# Patient Record
Sex: Male | Born: 1945 | Race: White | Hispanic: No | Marital: Married | Smoking: Former smoker
Health system: Southern US, Community
[De-identification: ages and names within clinical notes are randomized; demographics above are authoritative.]

## PROBLEM LIST (undated history)

## (undated) DIAGNOSIS — K2270 Barrett's esophagus without dysplasia: Secondary | ICD-10-CM

## (undated) DIAGNOSIS — M1990 Unspecified osteoarthritis, unspecified site: Secondary | ICD-10-CM

## (undated) DIAGNOSIS — K219 Gastro-esophageal reflux disease without esophagitis: Secondary | ICD-10-CM

## (undated) HISTORY — PX: BACK SURGERY: SHX140

## (undated) HISTORY — PX: COLON SURGERY: SHX602

## (undated) HISTORY — PX: CHOLECYSTECTOMY: SHX55

---

## 2006-01-25 ENCOUNTER — Ambulatory Visit: Payer: Self-pay | Admitting: Nurse Practitioner

## 2006-02-03 ENCOUNTER — Ambulatory Visit: Payer: Self-pay | Admitting: Unknown Physician Specialty

## 2007-02-01 ENCOUNTER — Ambulatory Visit: Payer: Self-pay | Admitting: Gastroenterology

## 2007-03-13 ENCOUNTER — Ambulatory Visit: Payer: Self-pay | Admitting: Internal Medicine

## 2007-05-08 ENCOUNTER — Ambulatory Visit: Payer: Self-pay | Admitting: Internal Medicine

## 2007-11-01 ENCOUNTER — Emergency Department: Payer: Self-pay | Admitting: Emergency Medicine

## 2007-11-01 ENCOUNTER — Other Ambulatory Visit: Payer: Self-pay

## 2007-11-01 ENCOUNTER — Ambulatory Visit: Payer: Self-pay | Admitting: Emergency Medicine

## 2007-11-02 ENCOUNTER — Ambulatory Visit: Payer: Self-pay

## 2007-11-05 ENCOUNTER — Ambulatory Visit: Payer: Self-pay | Admitting: Cardiology

## 2007-11-28 ENCOUNTER — Ambulatory Visit: Payer: Self-pay | Admitting: Unknown Physician Specialty

## 2008-11-06 ENCOUNTER — Ambulatory Visit: Payer: Self-pay | Admitting: Internal Medicine

## 2008-11-14 ENCOUNTER — Ambulatory Visit: Payer: Self-pay | Admitting: Internal Medicine

## 2008-11-27 ENCOUNTER — Ambulatory Visit: Payer: Self-pay | Admitting: Surgery

## 2008-12-02 ENCOUNTER — Inpatient Hospital Stay: Payer: Self-pay | Admitting: Surgery

## 2009-07-05 ENCOUNTER — Emergency Department: Payer: Self-pay | Admitting: Emergency Medicine

## 2009-07-20 ENCOUNTER — Ambulatory Visit: Payer: Self-pay | Admitting: Gastroenterology

## 2010-04-29 ENCOUNTER — Ambulatory Visit: Payer: Self-pay | Admitting: Internal Medicine

## 2010-05-04 ENCOUNTER — Ambulatory Visit: Payer: Self-pay | Admitting: Internal Medicine

## 2010-11-16 IMAGING — US ABDOMEN ULTRASOUND
1 series · 16 of 25 positions shown · non-contrast
Comparison: none

REASON FOR EXAM: abn liver function tests hematuria
COMMENTS:

[Series 1: abdomen ultrasound · 16 of 59 slices shown]
[im 1/59]
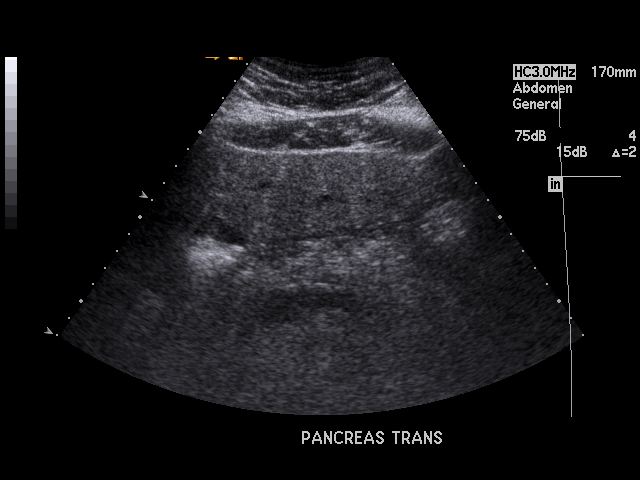
[im 5/59]
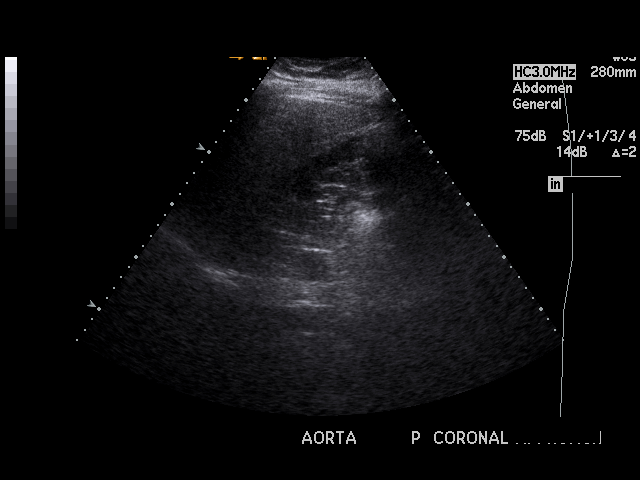
[im 8/59]
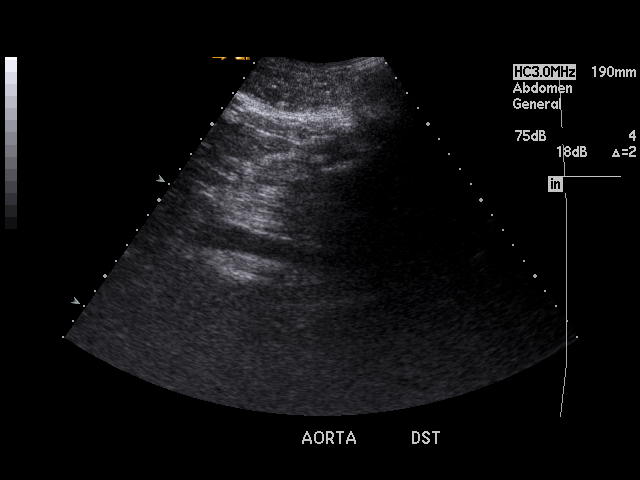
[im 13/59]
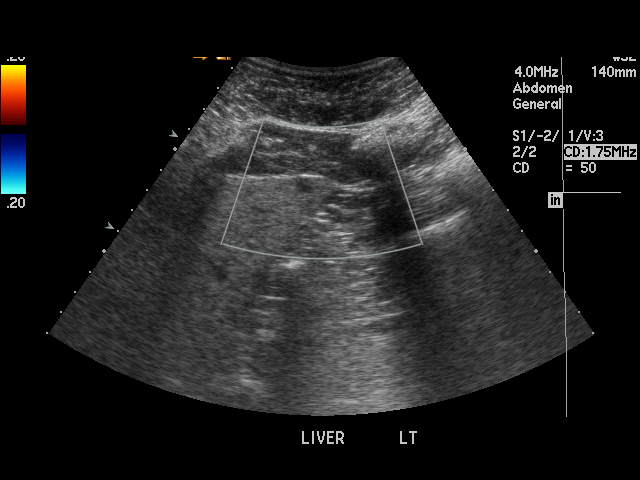
[im 17/59]
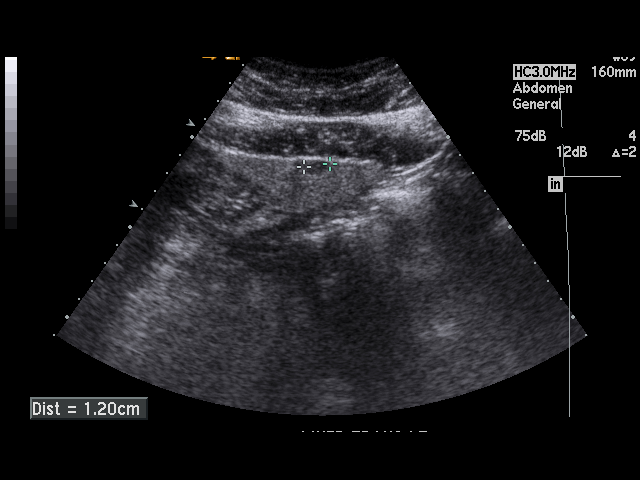
[im 20/59]
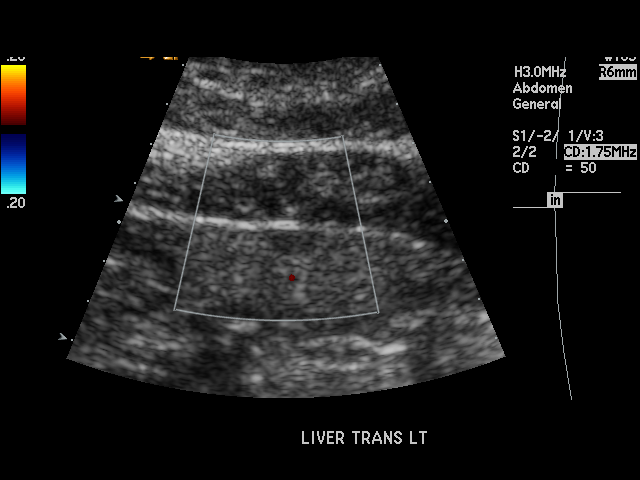
[im 25/59]
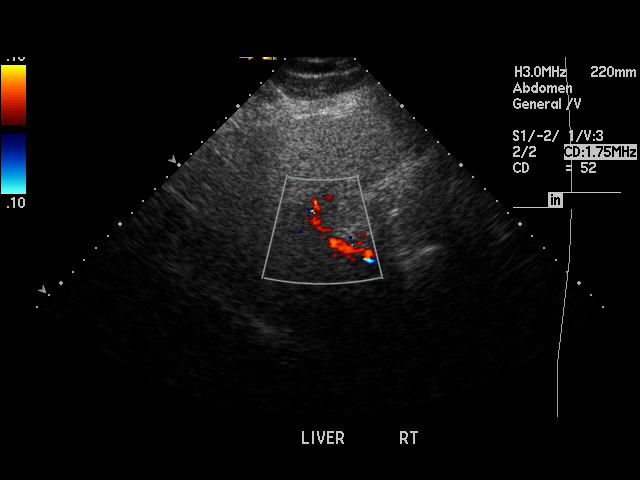
[im 27/59]
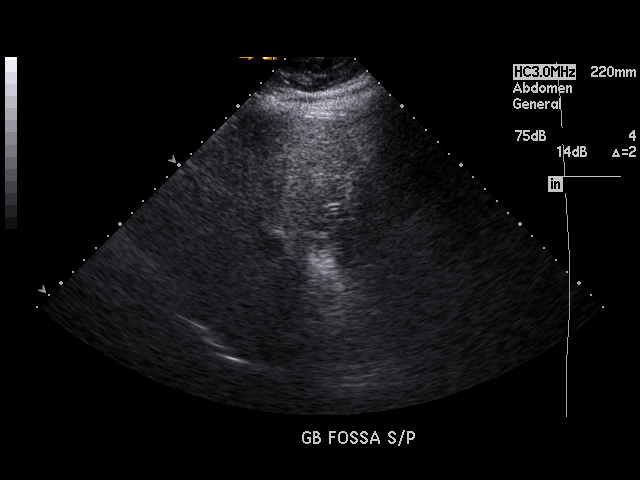
[im 32/59]
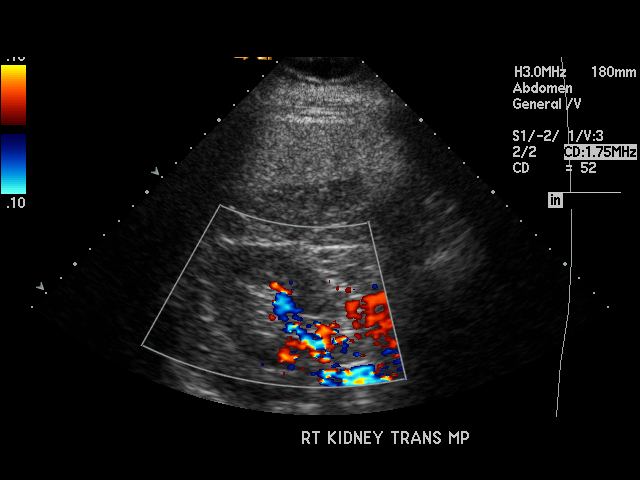
[im 34/59]
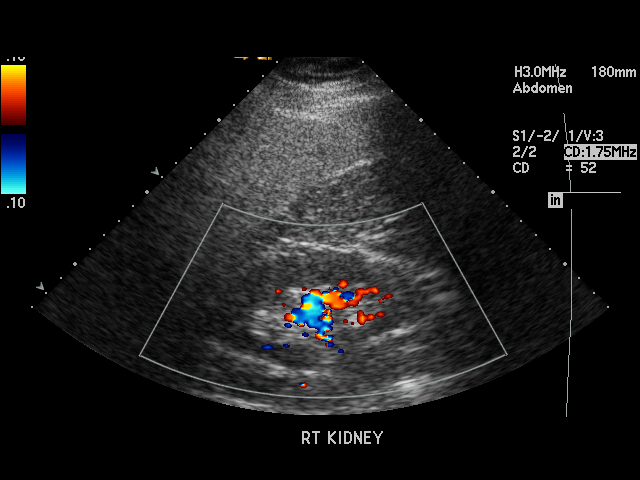
[im 39/59]
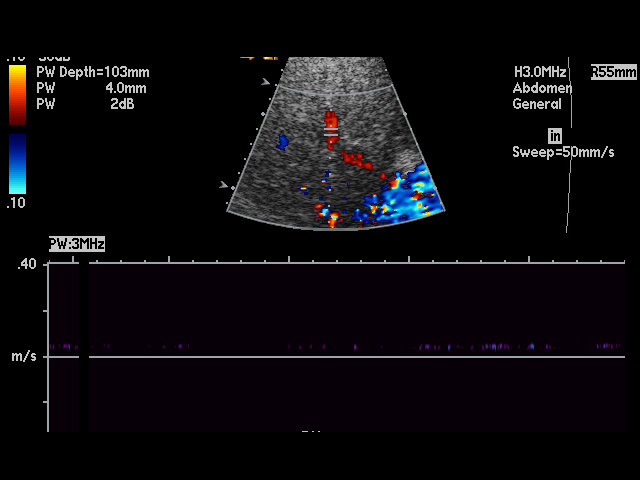
[im 42/59]
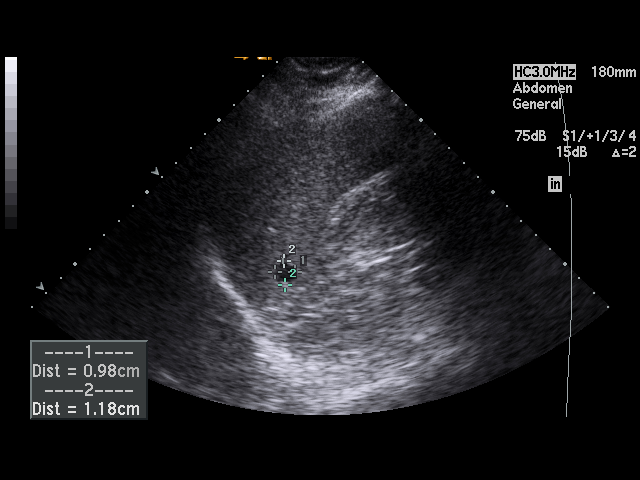
[im 46/59]
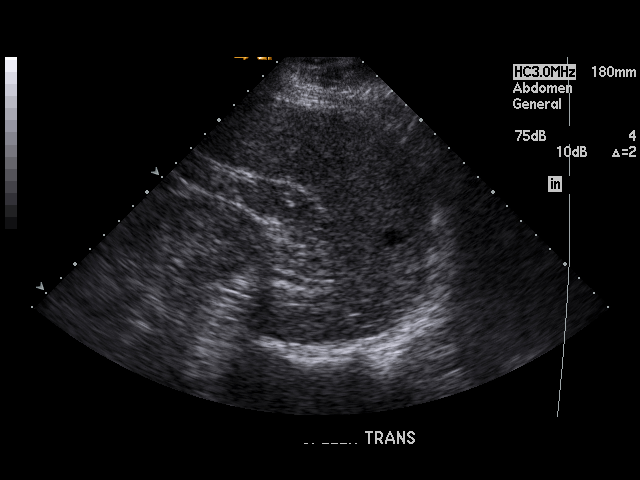
[im 51/59]
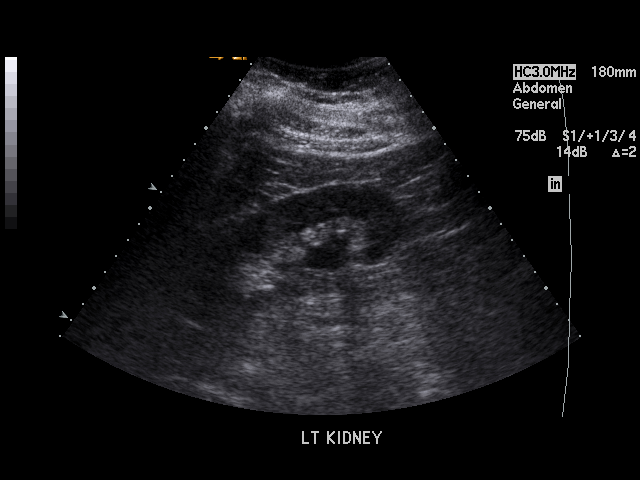
[im 54/59]
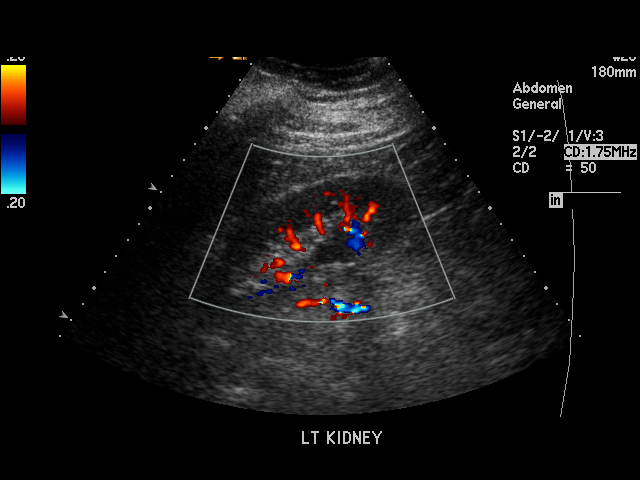
[im 59/59]
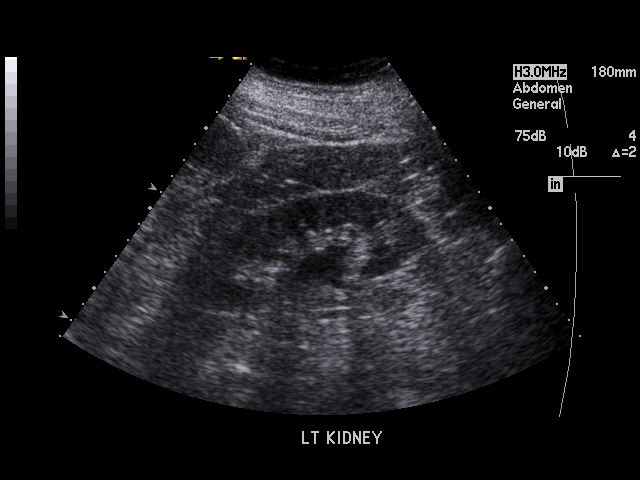

[16 of 25 positions shown; findings below may reference images not displayed]

PROCEDURE:     US  - US ABDOMEN GENERAL SURVEY  - April 29, 2010 [DATE]

RESULT:     The liver is hyperechogenic, consistent with fatty infiltration.
In the left lobe there is an oval shaped 1.2 cm sharply marginated
hypoechoic area along the anterior margin of the left lobe medially. There
are internal echoes present. No definite posterior enhancement is seen. The
finding may represent a cyst that is too small to characterize
sonographically but a small solid lesion cannot be excluded. Additional
evaluation by repeat liver CT is suggested or alternatively follow-up
examination to document stability. This finding is not seen on review of the
prior ultrasound July 20, 2009. The possible hemangioma noted at
prior CT is not identified sonographically on this exam.

The pancreas is not visualized adequately for evaluation. A portion of the
pancreatic body is visualized and shows no significant abnormalities but the
pancreas otherwise is obscured by bowel gas. The abdominal aorta and
inferior vena cava show no acute changes. The spleen is mildly enlarged. The
spleen measures 14.13 cm at maximum AP diameter. There is a 1.13 cm splenic
cyst. The gallbladder is not visualized compatible with a history of prior
cholecystectomy. The common bile duct measures 6.9 mm. The kidneys show no
hydronephrosis.
IMPRESSION: 1. Probable fatty infiltration of the liver.
2. There is a small hypoechoic mass anteriorly in the left lobe of the
liver. Although likely a small hepatic cyst, the sonographic characteristics
are not sufficient to be certain. This could be further evaluated by hepatic
CT.
3. The patient is status post cholecystectomy.
4. The pancreas is in great part obscured by bowel gas.
5. There is mild splenic enlargement.
6. No ascites is seen.

## 2011-03-08 ENCOUNTER — Ambulatory Visit: Payer: Self-pay | Admitting: Gastroenterology

## 2011-03-15 LAB — PATHOLOGY REPORT

## 2011-09-21 ENCOUNTER — Ambulatory Visit: Payer: Self-pay | Admitting: Gastroenterology

## 2011-09-23 LAB — PATHOLOGY REPORT

## 2012-01-30 ENCOUNTER — Ambulatory Visit: Payer: Self-pay | Admitting: Internal Medicine

## 2012-02-09 ENCOUNTER — Ambulatory Visit: Payer: Self-pay | Admitting: Oncology

## 2012-02-10 ENCOUNTER — Ambulatory Visit: Payer: Self-pay | Admitting: Oncology

## 2012-02-10 ENCOUNTER — Ambulatory Visit: Payer: Self-pay | Admitting: Gastroenterology

## 2012-02-13 ENCOUNTER — Ambulatory Visit: Payer: Self-pay | Admitting: Oncology

## 2012-02-17 LAB — CANCER ANTIGEN 19-9: CA 19-9: 12 U/mL (ref 0–35)

## 2012-02-20 ENCOUNTER — Ambulatory Visit: Payer: Self-pay | Admitting: Oncology

## 2012-02-22 ENCOUNTER — Other Ambulatory Visit: Payer: Self-pay

## 2012-02-22 ENCOUNTER — Telehealth: Payer: Self-pay

## 2012-02-22 DIAGNOSIS — R160 Hepatomegaly, not elsewhere classified: Secondary | ICD-10-CM

## 2012-02-22 NOTE — Telephone Encounter (Signed)
Pt has been notified meds reviewed and instructed pt instructions also mailed to his home

## 2012-02-22 NOTE — Telephone Encounter (Signed)
Pt needs EUS with propofol if possible on 02/28/12 noon block the 130 and 145 cases in the office.  Case put in EPIC waiting on Noreene Larsson to confirm

## 2012-02-22 NOTE — Telephone Encounter (Signed)
I spoke with Noreene Larsson and she confirmed the case for 02/28/12 noon with [facility]

## 2012-02-28 ENCOUNTER — Encounter (HOSPITAL_COMMUNITY): Payer: Self-pay

## 2012-02-28 ENCOUNTER — Ambulatory Visit (HOSPITAL_COMMUNITY): Payer: Medicare Other | Admitting: Anesthesiology

## 2012-02-28 ENCOUNTER — Ambulatory Visit (HOSPITAL_COMMUNITY)
Admission: RE | Admit: 2012-02-28 | Discharge: 2012-02-28 | Disposition: A | Discharge status: Home / Self Care | Payer: Medicare Other | Source: Ambulatory Visit | Attending: Gastroenterology | Admitting: Gastroenterology

## 2012-02-28 ENCOUNTER — Encounter (HOSPITAL_COMMUNITY)
Admission: RE | Disposition: A | Discharge status: Home / Self Care | Payer: Self-pay | Source: Ambulatory Visit | Attending: Gastroenterology

## 2012-02-28 ENCOUNTER — Encounter (HOSPITAL_COMMUNITY): Payer: Self-pay | Admitting: Anesthesiology

## 2012-02-28 DIAGNOSIS — K219 Gastro-esophageal reflux disease without esophagitis: Secondary | ICD-10-CM | POA: Insufficient documentation

## 2012-02-28 DIAGNOSIS — C801 Malignant (primary) neoplasm, unspecified: Secondary | ICD-10-CM | POA: Insufficient documentation

## 2012-02-28 DIAGNOSIS — K769 Liver disease, unspecified: Secondary | ICD-10-CM | POA: Insufficient documentation

## 2012-02-28 DIAGNOSIS — C772 Secondary and unspecified malignant neoplasm of intra-abdominal lymph nodes: Secondary | ICD-10-CM | POA: Insufficient documentation

## 2012-02-28 DIAGNOSIS — R933 Abnormal findings on diagnostic imaging of other parts of digestive tract: Secondary | ICD-10-CM

## 2012-02-28 HISTORY — DX: Barrett's esophagus without dysplasia: K22.70

## 2012-02-28 HISTORY — DX: Gastro-esophageal reflux disease without esophagitis: K21.9

## 2012-02-28 HISTORY — DX: Unspecified osteoarthritis, unspecified site: M19.90

## 2012-02-28 HISTORY — PX: EUS: SHX5427

## 2012-02-28 SURGERY — UPPER ENDOSCOPIC ULTRASOUND (EUS) LINEAR
Anesthesia: Monitor Anesthesia Care

## 2012-02-28 MED ORDER — LIDOCAINE HCL 1 % IJ SOLN
INTRAMUSCULAR | Status: DC | PRN
  Administered 2012-02-28: 50 mg via INTRADERMAL

## 2012-02-28 MED ORDER — FENTANYL CITRATE 0.05 MG/ML IJ SOLN
INTRAMUSCULAR | Status: DC | PRN
  Administered 2012-02-28: 100 ug via INTRAVENOUS

## 2012-02-28 MED ORDER — LACTATED RINGERS IV SOLN
INTRAVENOUS | Status: AC | PRN
  Administered 2012-02-28: 12:00:00 via INTRAVENOUS

## 2012-02-28 MED ORDER — PROPOFOL 10 MG/ML IV EMUL
INTRAVENOUS | Status: DC | PRN
  Administered 2012-02-28: 120 ug/kg/min via INTRAVENOUS

## 2012-02-28 MED ORDER — MIDAZOLAM HCL 5 MG/5ML IJ SOLN
INTRAMUSCULAR | Status: DC | PRN
  Administered 2012-02-28: 2 mg via INTRAVENOUS

## 2012-02-28 MED ORDER — LACTATED RINGERS IV SOLN
INTRAVENOUS | Status: DC
  Administered 2012-02-28: 1000 mL via INTRAVENOUS

## 2012-02-28 NOTE — Anesthesia Preprocedure Evaluation (Addendum)
Anesthesia Evaluation  Patient identified by MRN, date of birth, ID band Patient awake    Reviewed: Allergy & Precautions, H&P , NPO status , Patient's Chart, lab work & pertinent test results  Airway Mallampati: III TM Distance: >3 FB Neck ROM: full    Dental  (+) Caps and Dental Advisory Given,    Pulmonary neg pulmonary ROS,  breath sounds clear to auscultation  Pulmonary exam normal       Cardiovascular Exercise Tolerance: Good negative cardio ROS  Rhythm:regular Rate:Normal     Neuro/Psych negative neurological ROS  negative psych ROS   GI/Hepatic negative GI ROS, Neg liver ROS, GERD-  Medicated and Controlled,  Endo/Other  negative endocrine ROS  Renal/GU negative Renal ROS  negative genitourinary   Musculoskeletal   Abdominal   Peds  Hematology negative hematology ROS (+)   Anesthesia Other Findings   Reproductive/Obstetrics negative OB ROS                          Anesthesia Physical Anesthesia Plan  ASA: I  Anesthesia Plan: MAC   Post-op Pain Management:    Induction:   Airway Management Planned:   Additional Equipment:   Intra-op Plan:   Post-operative Plan:   Informed Consent: I have reviewed the patients History and Physical, chart, labs and discussed the procedure including the risks, benefits and alternatives for the proposed anesthesia with the patient or authorized representative who has indicated his/her understanding and acceptance.   Dental Advisory Given  Plan Discussed with: CRNA and Surgeon  Anesthesia Plan Comments:         Anesthesia Quick Evaluation

## 2012-02-28 NOTE — Op Note (Signed)
[facility] [address] [place], [place]  [address]  ENDOSCOPIC ULTRASOUND PROCEDURE REPORT  PATIENT:  Jon Roberts, Jon Roberts  MR#:  [id number] BIRTHDATE:  1946/04/28  GENDER:  male ENDOSCOPIST:  Rachael Fee, MD PROCEDURE DATE:  02/28/2012 REFERRING:   Christian Mate, MD at [facility] PROCEDURE:  Upper EUS w/FNA ASA CLASS:  Class II INDICATIONS:  large mass in liver, periportal adenopathy (all PET avid).  AFP > 2,000 MEDICATIONS:   MAC sedation, administered by CRNA  DESCRIPTION OF PROCEDURE:   After the risks benefits and alternatives of the procedure were  explained, informed consent was obtained. The patient was then placed in the left, lateral, decubitus postion and IV sedation was administered. Throughout the procedure, the patient's blood pressure, pulse and oxygen saturations were monitored continuously.  Under direct visualization, the Pentax Radial EUS [id number] and Pentax Linear [id number] endoscope was introduced through the mouth and advanced to the second portion of the duodenum.  Water was used as necessary to provide an acoustic interface.  Upon completion of the imaging, water was removed and the patient was sent to the recovery room in satisfactory condition. <<PROCEDUREIMAGES>>  Endoscopic findings: 1. Normal esophagus, stomach and duodenum  EUS findings: 1. Poorly visualized, irregularly bordered large mass in right lobe of liver. 2. Large, somewhat necrotic appearing, periportal mass that is likely a lymphnode vs tumor deposit.  This as 4.7cm maximally and was sampled with two passes of a 22 gauge BS EUS FNA needle using color doppler. 3. Otherwise limited views of liver, speen, pancreas were all normal  Impression: Mass in liver, periportal adenopathy (vs tumor deposit). A large periportal lymphnode was sampled with FNA, preliminary cytology review is positive for malignancy.  Special staining to check for [facility] will be  performed.  ______________________________ Rachael Fee, MD  n. eSIGNED:   Rachael Fee at 02/28/2012 12:54 PM  Tsosie Billing, [id number]

## 2012-02-28 NOTE — Transfer of Care (Signed)
Immediate Anesthesia Transfer of Care Note  Patient: Jon Roberts  Procedure(s) Performed: Procedure(s) (LRB): UPPER ENDOSCOPIC ULTRASOUND (EUS) LINEAR (N/A)  Patient Location: PACU  Anesthesia Type: MAC  Level of Consciousness: oriented, sedated and patient cooperative  Airway & Oxygen Therapy: Patient Spontanous Breathing and Patient connected to nasal cannula oxygen  Post-op Assessment: Report given to PACU RN, Post -op Vital signs reviewed and stable and Patient moving all extremities  Post vital signs: Reviewed and stable  Complications: No apparent anesthesia complications

## 2012-02-28 NOTE — H&P (Signed)
  HPI: This is a 66 yo man found to have liver mass, periportal lymphnodes.  AFP very elevated    Past Medical History  Diagnosis Date  . GERD (gastroesophageal reflux disease)   . Arthritis   . Barrett's esophagus     Past Surgical History  Procedure Date  . Colon surgery     polyp  . Back surgery     x2  . Cholecystectomy     Current Facility-Administered Medications  Medication Dose Route Frequency Provider Last Rate Last Dose  . lactated ringers infusion   Intravenous Continuous Rachael Fee, MD 20 mL/hr at 02/28/12 1132 1,000 mL at 02/28/12 1132    Allergies as of 02/22/2012  . (Not on File)    History reviewed. No pertinent family history.  History   Social History  . Marital Status: Married    Spouse Name: N/A    Number of Children: N/A  . Years of Education: N/A   Occupational History  . Not on file.   Social History Main Topics  . Smoking status: Former Smoker    Quit date: 02/28/2012  . Smokeless tobacco: Not on file  . Alcohol Use: Yes  . Drug Use: No  . Sexually Active:    Other Topics Concern  . Not on file   Social History Narrative  . No narrative on file      Physical Exam: BP 140/85  Temp 97.8 F (36.6 C) (Oral)  Resp 13  Ht 5\' 9"  (1.753 m)  Wt 205 lb (92.987 kg)  BMI 30.27 kg/m2  SpO2 96% Constitutional: generally well-appearing Psychiatric: alert and oriented x3 Abdomen: soft, nontender, nondistended, no obvious ascites, no peritoneal signs, normal bowel sounds     Assessment and plan: 66 y.o. male with likely HCC, here for EUS, FNA

## 2012-02-29 ENCOUNTER — Encounter (HOSPITAL_COMMUNITY): Payer: Self-pay | Admitting: Gastroenterology

## 2012-03-22 ENCOUNTER — Ambulatory Visit: Payer: Self-pay | Admitting: Oncology

## 2012-04-09 LAB — COMPREHENSIVE METABOLIC PANEL
Albumin: 2.9 g/dL — ABNORMAL LOW (ref 3.4–5.0)
Anion Gap: 10 (ref 7–16)
BUN: 10 mg/dL (ref 7–18)
Bilirubin,Total: 1.3 mg/dL — ABNORMAL HIGH (ref 0.2–1.0)
Chloride: 104 mmol/L (ref 98–107)
Creatinine: 1.05 mg/dL (ref 0.60–1.30)
EGFR (African American): >60
EGFR (Non-African Amer.): >60
Glucose: 109 mg/dL — ABNORMAL HIGH (ref 65–99)
SGOT(AST): 67 U/L — ABNORMAL HIGH (ref 15–37)
Total Protein: 8.3 g/dL — ABNORMAL HIGH (ref 6.4–8.2)

## 2012-04-09 LAB — CBC CANCER CENTER
Eosinophil %: 2 %
HCT: 45.9 % (ref 40.0–52.0)
Lymphocyte #: 1.2 x10 3/mm (ref 1.0–3.6)
MCH: 26.6 pg (ref 26.0–34.0)
MCV: 84 fL (ref 80–100)
Monocyte #: 0.3 x10 3/mm (ref 0.2–1.0)
Neutrophil #: 3.2 x10 3/mm (ref 1.4–6.5)
Platelet: 141 x10 3/mm — ABNORMAL LOW (ref 150–440)
RDW: 16.4 % — ABNORMAL HIGH (ref 11.5–14.5)

## 2012-04-10 LAB — AFP TUMOR MARKER: AFP-Tumor Marker: 1173 ng/mL — ABNORMAL HIGH (ref 0.0–8.3)

## 2012-04-22 ENCOUNTER — Ambulatory Visit: Payer: Self-pay | Admitting: Oncology

## 2012-05-03 ENCOUNTER — Inpatient Hospital Stay: Payer: Self-pay

## 2012-05-03 LAB — COMPREHENSIVE METABOLIC PANEL
Albumin: 2.7 g/dL — ABNORMAL LOW (ref 3.4–5.0)
Alkaline Phosphatase: 348 U/L — ABNORMAL HIGH (ref 50–136)
Calcium, Total: 8.9 mg/dL (ref 8.5–10.1)
Chloride: 106 mmol/L (ref 98–107)
Co2: 23 mmol/L (ref 21–32)
EGFR (African American): >60
EGFR (Non-African Amer.): >60
Glucose: 130 mg/dL — ABNORMAL HIGH (ref 65–99)
Osmolality: 274 (ref 275–301)
SGOT(AST): 107 U/L — ABNORMAL HIGH (ref 15–37)
Sodium: 137 mmol/L (ref 136–145)

## 2012-05-03 LAB — CBC
HCT: 42.2 % (ref 40.0–52.0)
MCH: 27.5 pg (ref 26.0–34.0)
MCHC: 33 g/dL (ref 32.0–36.0)
MCV: 83 fL (ref 80–100)
Platelet: 136 10*3/uL — ABNORMAL LOW (ref 150–440)
RBC: 5.07 10*6/uL (ref 4.40–5.90)
WBC: 4.5 10*3/uL (ref 3.8–10.6)

## 2012-05-03 LAB — CK TOTAL AND CKMB (NOT AT ARMC)
CK, Total: 52 U/L (ref 35–232)
CK-MB: <0.5 ng/mL — ABNORMAL LOW (ref 0.5–3.6)
CK-MB: <0.5 ng/mL — ABNORMAL LOW (ref 0.5–3.6)

## 2012-05-03 LAB — TROPONIN I: Troponin-I: <0.02 ng/mL

## 2012-05-04 LAB — TROPONIN I: Troponin-I: <0.02 ng/mL

## 2012-05-04 LAB — CK TOTAL AND CKMB (NOT AT ARMC)
CK, Total: 38 U/L (ref 35–232)
CK-MB: <0.5 ng/mL — ABNORMAL LOW (ref 0.5–3.6)

## 2012-05-08 ENCOUNTER — Ambulatory Visit: Payer: Self-pay | Admitting: Oncology

## 2012-05-11 ENCOUNTER — Ambulatory Visit: Payer: Self-pay | Admitting: Oncology

## 2012-05-11 LAB — PROTIME-INR
INR: 1.1
Prothrombin Time: 14.4 secs (ref 11.5–14.7)

## 2012-05-11 LAB — PLATELET COUNT: Platelet: 170 10*3/uL (ref 150–440)

## 2012-05-11 LAB — APTT: Activated PTT: 34 secs (ref 23.6–35.9)

## 2012-05-22 ENCOUNTER — Ambulatory Visit: Payer: Self-pay | Admitting: Oncology

## 2012-05-28 ENCOUNTER — Ambulatory Visit: Payer: Self-pay | Admitting: Oncology

## 2012-05-28 LAB — COMPREHENSIVE METABOLIC PANEL
Alkaline Phosphatase: 446 U/L — ABNORMAL HIGH (ref 50–136)
BUN: 8 mg/dL (ref 7–18)
Bilirubin,Total: 8.1 mg/dL — ABNORMAL HIGH (ref 0.2–1.0)
Chloride: 102 mmol/L (ref 98–107)
Co2: 25 mmol/L (ref 21–32)
Creatinine: 0.95 mg/dL (ref 0.60–1.30)
EGFR (African American): >60
EGFR (Non-African Amer.): >60
Glucose: 113 mg/dL — ABNORMAL HIGH (ref 65–99)
Osmolality: 271 (ref 275–301)
Potassium: 3.6 mmol/L (ref 3.5–5.1)
SGOT(AST): 210 U/L — ABNORMAL HIGH (ref 15–37)
Sodium: 136 mmol/L (ref 136–145)

## 2012-05-28 LAB — CBC CANCER CENTER
Eosinophil %: 1.5 %
HCT: 44.6 % (ref 40.0–52.0)
Lymphocyte #: 1 x10 3/mm (ref 1.0–3.6)
MCH: 27.1 pg (ref 26.0–34.0)
MCV: 86 fL (ref 80–100)
Monocyte %: 6.6 %
Neutrophil #: 3.9 x10 3/mm (ref 1.4–6.5)
RBC: 5.2 10*6/uL (ref 4.40–5.90)
RDW: 21.4 % — ABNORMAL HIGH (ref 11.5–14.5)
WBC: 5.4 x10 3/mm (ref 3.8–10.6)

## 2012-06-05 LAB — COMPREHENSIVE METABOLIC PANEL
Albumin: 2.3 g/dL — ABNORMAL LOW (ref 3.4–5.0)
Alkaline Phosphatase: 798 U/L — ABNORMAL HIGH (ref 50–136)
BUN: 23 mg/dL — ABNORMAL HIGH (ref 7–18)
Calcium, Total: 8.7 mg/dL (ref 8.5–10.1)
Co2: 23 mmol/L (ref 21–32)
Creatinine: 0.76 mg/dL (ref 0.60–1.30)
EGFR (Non-African Amer.): >60
Glucose: 150 mg/dL — ABNORMAL HIGH (ref 65–99)
Osmolality: 275 (ref 275–301)
SGPT (ALT): 255 U/L — ABNORMAL HIGH (ref 12–78)

## 2012-06-05 LAB — CBC CANCER CENTER
Basophil %: 0.3 %
Eosinophil %: 0 %
HCT: 43.1 % (ref 40.0–52.0)
Lymphocyte #: 0.6 x10 3/mm — ABNORMAL LOW (ref 1.0–3.6)
MCH: 27.1 pg (ref 26.0–34.0)
MCV: 84 fL (ref 80–100)
Monocyte #: 0.6 x10 3/mm (ref 0.2–1.0)
Monocyte %: 5.3 %
Neutrophil #: 10.7 x10 3/mm — ABNORMAL HIGH (ref 1.4–6.5)
Platelet: 178 x10 3/mm (ref 150–440)
RBC: 5.12 10*6/uL (ref 4.40–5.90)
WBC: 12 x10 3/mm — ABNORMAL HIGH (ref 3.8–10.6)

## 2012-06-20 ENCOUNTER — Observation Stay: Payer: Self-pay

## 2012-06-20 LAB — COMPREHENSIVE METABOLIC PANEL
Albumin: 2.3 g/dL — ABNORMAL LOW (ref 3.4–5.0)
Anion Gap: 9 (ref 7–16)
BUN: 29 mg/dL — ABNORMAL HIGH (ref 7–18)
Bilirubin,Total: 15.7 mg/dL — ABNORMAL HIGH (ref 0.2–1.0)
Calcium, Total: 8.4 mg/dL — ABNORMAL LOW (ref 8.5–10.1)
Chloride: 99 mmol/L (ref 98–107)
Co2: 22 mmol/L (ref 21–32)
Creatinine: 0.56 mg/dL — ABNORMAL LOW (ref 0.60–1.30)
EGFR (African American): >60
EGFR (Non-African Amer.): >60
Osmolality: 267 (ref 275–301)
Potassium: 4.7 mmol/L (ref 3.5–5.1)
Sodium: 130 mmol/L — ABNORMAL LOW (ref 136–145)

## 2012-06-20 LAB — CBC WITH DIFFERENTIAL/PLATELET
Basophil #: 0 10*3/uL (ref 0.0–0.1)
Basophil %: 0.2 %
Eosinophil #: 0 10*3/uL (ref 0.0–0.7)
Eosinophil %: 0 %
HCT: 43.3 % (ref 40.0–52.0)
Lymphocyte %: 3.2 %
MCH: 29.4 pg (ref 26.0–34.0)
MCV: 86 fL (ref 80–100)
Monocyte %: 4.5 %
Platelet: 111 10*3/uL — ABNORMAL LOW (ref 150–440)
RBC: 5.04 10*6/uL (ref 4.40–5.90)
WBC: 11.1 10*3/uL — ABNORMAL HIGH (ref 3.8–10.6)

## 2012-06-20 LAB — URINALYSIS, COMPLETE
Glucose,UR: NEGATIVE mg/dL (ref 0–75)
Nitrite: NEGATIVE
Ph: 6 (ref 4.5–8.0)
Specific Gravity: 1.024 (ref 1.003–1.030)
WBC UR: 3 /HPF (ref 0–5)

## 2012-06-21 LAB — CBC WITH DIFFERENTIAL/PLATELET
Basophil #: 0 10*3/uL (ref 0.0–0.1)
Eosinophil #: 0 10*3/uL (ref 0.0–0.7)
HCT: 40.8 % (ref 40.0–52.0)
Lymphocyte #: 0.6 10*3/uL — ABNORMAL LOW (ref 1.0–3.6)
Lymphocyte %: 5.7 %
Monocyte #: 0.5 x10 3/mm (ref 0.2–1.0)
Monocyte %: 4.6 %
Neutrophil #: 9.3 10*3/uL — ABNORMAL HIGH (ref 1.4–6.5)
Neutrophil %: 89.2 %
Platelet: 60 10*3/uL — ABNORMAL LOW (ref 150–440)
RDW: 26.8 % — ABNORMAL HIGH (ref 11.5–14.5)
WBC: 10.4 10*3/uL (ref 3.8–10.6)

## 2012-06-21 LAB — COMPREHENSIVE METABOLIC PANEL
Albumin: 2 g/dL — ABNORMAL LOW (ref 3.4–5.0)
Anion Gap: 13 (ref 7–16)
BUN: 30 mg/dL — ABNORMAL HIGH (ref 7–18)
Bilirubin,Total: 15.6 mg/dL — ABNORMAL HIGH (ref 0.2–1.0)
Calcium, Total: 8.2 mg/dL — ABNORMAL LOW (ref 8.5–10.1)
Chloride: 101 mmol/L (ref 98–107)
Co2: 19 mmol/L — ABNORMAL LOW (ref 21–32)
EGFR (African American): >60
Osmolality: 272 (ref 275–301)
Potassium: 4.1 mmol/L (ref 3.5–5.1)
SGOT(AST): 249 U/L — ABNORMAL HIGH (ref 15–37)

## 2012-06-22 ENCOUNTER — Ambulatory Visit: Payer: Self-pay | Admitting: Oncology

## 2012-06-24 ENCOUNTER — Ambulatory Visit: Payer: Self-pay | Admitting: Oncology

## 2012-07-22 DEATH — deceased

## 2013-01-07 IMAGING — CR DG CHEST 1V PORT
1 series · 1 of 1 positions shown · non-contrast
Comparison: none

REASON FOR EXAM: weak
COMMENTS:

PROCEDURE:     DXR - DXR PORTABLE CHEST SINGLE VIEW  - June 20, 2012  [DATE]
RESULT:     Comparison is made to a prior study dated 05/03/2012.

[portable]
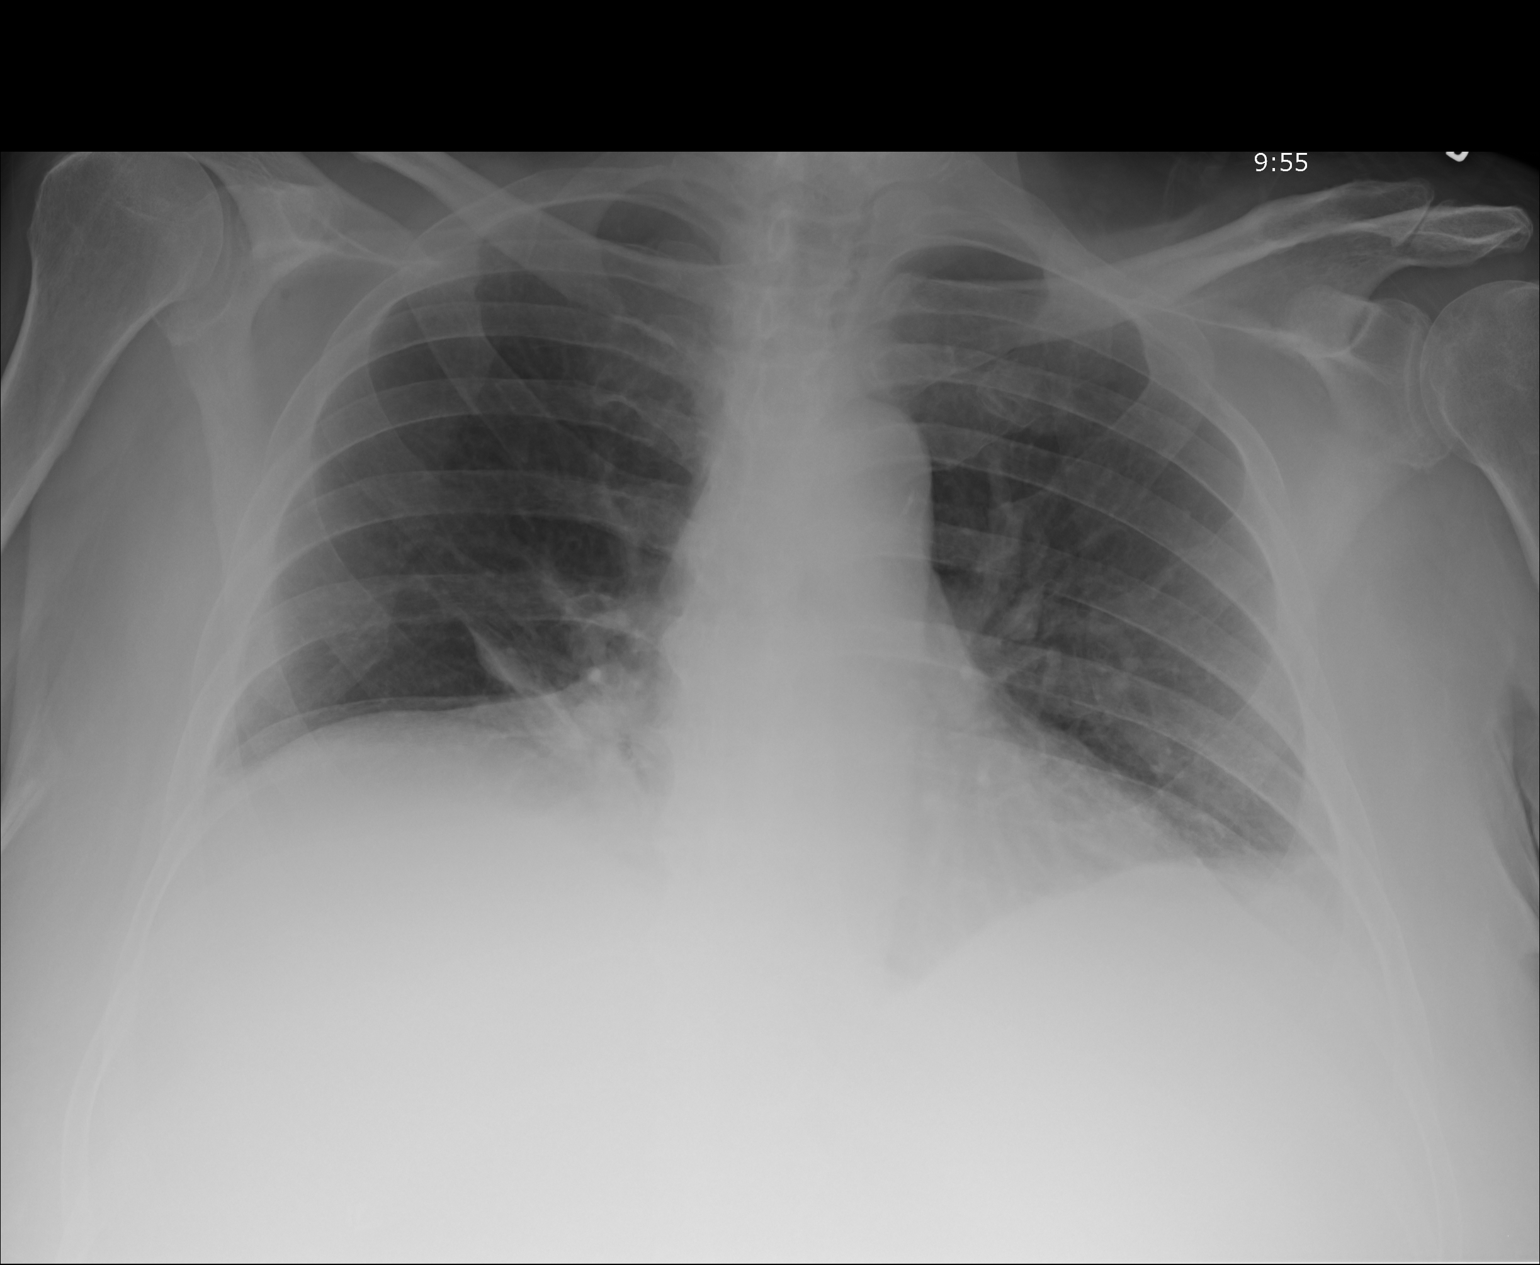

[1 of 1 positions shown; findings below may reference images not displayed]

FINDINGS: The patient has taken a shallow inspiration. With technique taken
into consideration, atelectasis versus infiltrate is appreciated in the
region of the right middle lobe. The cardiac silhouette and visualized bony
skeleton is unremarkable.
IMPRESSION: 1.  Shallow inspiration.
2.  Atelectasis versus infiltrate, right middle lobe.

## 2014-12-09 NOTE — Discharge Summary (Signed)
PATIENT NAME:  Jon Roberts, Jon Roberts MR#:  [id number] DATE OF BIRTH:  [birth year]  DATE OF ADMISSION:  05/03/2012 DATE OF DISCHARGE:  05/04/2012  PRIMARY CARE PHYSICIAN:  Cheral Marker. Ola Spurr, MD  ONCOLOGIST:  Kathlene November. Grayland Ormond, MD  DISCHARGE DIAGNOSES: 1. Chest pain. 2. Questionable epicardial mass on CT scan. 3. Hepatocellular cancer undergoing treatment.   HISTORY OF PRESENT ILLNESS:  This is a 69 year old gentleman with hepatocellular cancer relatively recently diagnosed also with portal vein thrombosis. He is undergoing chemotherapy. The patient is unresectable. He was admitted on the 12th of September with a complaint of chest pain and shortness of breath. He felt like a block was sitting on his chest and it was hard for him to take a breath.   HOSPITAL COURSE BY ISSUE: 1. Chest pain. He ruled out for cardiac MI by cardiac enzymes. He had an elevated D-dimer and had a CT scan with PE protocol, which was negative for pulmonary embolism but did show a possible epicardial mass. He was seen by Dr Nehemiah Massed in Cardiology. He had a Myoview done, which was negative for any active ischemia. He was discharged home for followup of this.  2. Hepatocellular cancer. He continues to follow with Dr Grayland Ormond. 3. Epicardial mass. This will need to be followed up on CT scan.  DISCHARGE MEDICATIONS: 1. Nexavar 2 tablets 2 times a day. 2. Omeprazole, dose was increased to 40 mg daily.  DISCHARGE DIET:  Regular.  ACTIVITY LIMITATIONS:  As tolerated.   DISCHARGE FOLLOWUP:  The patient is to follow up with Dr Ola Spurr in 2-4 weeks and also with Dr Grayland Ormond in 2-4 weeks.   DISCHARGE TIME:  This discharge took 40 minutes to complete.    ____________________________ Cheral Marker. Ola Spurr, MD dpf:ljs D: 05/10/2012 16:30:56 ET T: 05/14/2012 10:20:04 ET JOB#: [id number]  cc: Cheral Marker. Ola Spurr, MD, <Dictator> Kathlene November. Grayland Ormond, MD DAVID [facility] MD ELECTRONICALLY SIGNED 05/15/2012 18:40

## 2014-12-09 NOTE — H&P (Signed)
PATIENT NAME:  Jon Roberts, Jon Roberts MR#:  [id number] DATE OF BIRTH:  [birth year]  DATE OF ADMISSION:  06/20/2012  PRIMARY CARE PHYSICIAN:  Adrian Prows, MD  ONCOLOGIST: Dr. Norberto Sorenson - [facility]   CHIEF COMPLAINT: "I can hardly move."  HISTORY OF PRESENT ILLNESS: This is a 69 year old man with history of hepatocellular carcinoma. He has recently switched from Dr. Grayland Ormond oncology to Dr. Norberto Sorenson oncology for a second opinion. He was last seen on 06/05/2012 by Dr. Grayland Ormond who recommended hospice at that time secondary to increased bilirubin and the hepatocellular carcinoma and was no longer a candidate from his standpoint for systemic chemotherapy. They transferred care to Dr. Norberto Sorenson at [facility]. The ER physician spoke with then who stated that the patient was not a candidate for chemotherapy secondary to elevated bilirubin. The patient presents with a gradual weakness, he can hardly move his legs and his arms, decreased feeling, he cannot stand, he had a fall last night and they needed rescue to get him up, and this has been worsening over the past 2 to 3 days. He was told to hold off on the furosemide and spironolactone. He has not needed the pain medications or nausea medications. Hospitalist services were contacted for further evaluation.   PAST MEDICAL HISTORY:  1. Hepatocellular carcinoma with increasing bilirubin. 2. Ascites.  3. Gastroesophageal reflux disease. 4. Arthritis.   PAST SURGICAL HISTORY:  1. Two back surgeries. 2. Colon surgery. 3. Gallbladder surgery.   ALLERGIES: Celebrex.     MEDICATIONS: As per prescription writer. 1. Dexamethasone 4 mg daily.  2. Lasix 20 mg daily.  3. Omeprazole 40 mg daily.  4. Oxycodone 10 mg as needed for pain.  5. Prochlorperazine 10 mg every six hours as needed for nausea and vomiting. 6. Spironolactone 50 mg every other day. 7. Tylenol 650 mg every four hours as needed.   SOCIAL HISTORY: Lives with wife. Quit smoking 30 years ago. Stopped  snuff six weeks ago. No alcohol. No drug use. Was a Patent examiner. Does have home health coming into the house.   FAMILY HISTORY: Father died at 73 of heart related issues. Mother died at 18 of pneumonia.   REVIEW OF SYSTEMS: CONSTITUTIONAL: Positive for weakness. No fever, chills, or sweats. Positive for poor appetite. EYES: He wears glasses. EARS, NOSE, MOUTH, AND THROAT: No hearing loss. No sore throat. No difficulty swallowing. CARDIOVASCULAR: No chest pain. No palpitations. RESPIRATORY: Positive for shortness of breath when walking. GASTROINTESTINAL: No nausea, no vomiting, no abdominal pain, no diarrhea, no constipation, no bright red blood per rectum, and no melena. GENITOURINARY: No burning on urination or hematuria. MUSCULOSKELETAL: No joint pain or muscle pain. INTEGUMENT: Ulcers. Positive for skin turning yellow. NEUROLOGIC: No fainting or blackouts. PSYCHIATRIC: No anxiety or depression. ENDOCRINE: No thyroid problems. HEMATOLOGIC/LYMPHATIC: No anemia. GENITOURINARY: No burning on urination or hematuria.   PHYSICAL EXAMINATION:   VITAL SIGNS: Temperature 97.7, pulse 98, respirations 16, blood pressure 111/70, and pulse oximetry 94% on room air.   GENERAL: No respiratory distress, lying flat in bed.   EYES: Conjunctivae icteric. Lids normal. Pupils equal, round, and reactive to light. Extraocular muscles intact. No nystagmus.   EARS, NOSE, MOUTH, AND THROAT: Tympanic membranes no erythema. Nasal mucosa no erythema. Throat no erythema. No exudate seen. Lips and gums no lesions.   NECK: No JVD. No bruits. No lymphadenopathy. No thyromegaly. No thyroid nodules palpated.   RESPIRATORY: Lungs are clear to auscultation. No use of accessory muscles to breathe. No rhonchi,  rales, or wheeze heard.   CARDIOVASCULAR: S1 and S2 normal. No gallops, rubs, or murmurs heard. Carotid upstroke 2+ bilaterally. No bruits.   EXTREMITIES: Dorsalis pedis pulses 1+ bilaterally. 3+  edema in bilateral lower extremities, pitting.  ABDOMEN: Soft, distended. No pain. Difficult to palpate organosplenomegaly secondary to distention. No masses felt.   LYMPHATIC: No lymph nodes in the neck.   MUSCULOSKELETAL: 3+ edema. No clubbing or cyanosis.   SKIN: Yellow spidery hemangiomas over the stomach.   NEUROLOGIC: Cranial nerves II through XII grossly intact. Deep tendon reflexes 1+ bilateral lower extremities. The patient is unable to straight leg raise either leg. The patient is able to lift arms over the head.   PSYCHIATRIC: The patient is oriented to person, place, and time.   LABORATORY, DIAGNOSTIC AND RADIOLOGIC DATA: CT scan of the head: No acute abnormality.   EKG: Normal sinus rhythm, 95 beats per minute.   Chest x-ray: Shallow inspiration.   White blood cell count 11.1, hemoglobin and hematocrit 14.8 and 43.3, and platelet count 111. Glucose 104, BUN 29, creatinine 0.56, sodium 130, potassium 4.7, chloride 99, CO2 22, calcium 8.4, total bilirubin 15.7, alkaline phosphatase 778, ALT 388, AST 283, total protein 6.4, albumin 2.3. Troponin negative. TSH 0.182.  Urinalysis negative   ASSESSMENT AND PLAN:  1. Progressive weakness most likely secondary to the hepatocellular carcinoma with worsening bilirubin and ascites and edema. We will get physical therapy evaluation to see what he is capable of doing. May end up needing rehab.  2. Hepatocellular carcinoma with increasing bilirubin. Has been seen by Dr. Grayland Ormond in the past who recommended hospice. Seen for second opinion by Dr. Norberto Sorenson at [facility]. I advised the patient and family to speak with Dr. Norberto Sorenson again about prognosis. I will get a palliative care consultation. Would qualify for hospice.  3. Ascites. I will give 1 liter of IV fluid and hold the spironolactone and Lasix for today, will probably end up restarting tomorrow.  4. Gastroesophageal reflux disease. On omeprazole.  5. Arthritis. PRN Tylenol. 6. Of note,  the chest x-ray was written as atelectasis versus infiltrate, right middle lobe. The patient is not complaining of a cough. No fever at this time. We will hold off on antibiotics.   TIME SPENT ON ADMISSION: 55 minutes.  ____________________________ Tana Conch. Leslye Peer, MD rjw:slb D: 06/20/2012 15:03:03 ET T: 06/20/2012 15:23:17 ET JOB#: [id number]  cc: Tana Conch. Leslye Peer, MD, <Dictator> Cheral Marker. Ola Spurr, MD Dr. Norberto Sorenson - [facility] Marisue Brooklyn MD ELECTRONICALLY SIGNED 06/24/2012 14:13

## 2014-12-09 NOTE — Discharge Summary (Signed)
PATIENT NAME:  Jon Roberts, Jon Roberts MR#:  [id number] DATE OF BIRTH:  16-Nov-1945  DATE OF ADMISSION:  06/20/2012 DATE OF DISCHARGE:  06/23/2012  PRIMARY CARE PHYSICIAN: Dr. Adrian Prows  DISCHARGE DIAGNOSES:  1. Advanced hepatocellular cancer.  2. Dehydration.  3. Failure to thrive.   HISTORY OF PRESENT ILLNESS: Please see admission history and physical. In brief this 69 year old with advanced hepatocellular cancer who was brought in by his wife for failure to thrive. He had been seeing a second opinion at [facility]. He been unable to take any orals and was quite weak, unable to walk at all at home.   HOSPITAL COURSE BY ISSUE:  1. Failure to thrive. This is likely due to his advanced hepatocellular cancer. He was treated with IV fluids.  2. Hepatocellular cancer. We discussed with the family as well as with his oncologist at [facility], Dr. Norberto Sorenson. This is end-stage process, there is no further treatment available. After discussion with his wife she was willing to accept this and has planned to take him home with home hospice. Palliative care was also involved and we thank them for their help.    DISCHARGE MEDICATIONS:  1. Omeprazole 40 mg once a day.  2. Dexamethasone 4 mg once a day.  3. Lasix 20 mg once a day.  4. Spironolactone 50 mg once a day.  5. Oxycodone 10 mg p.r.n.  6. Prochlorperazine 10 mg every six hours as needed for nausea, vomiting.   DISCHARGE DIET: Regular consistency.   ACTIVITY LIMITATIONS: As tolerated.  DISCHARGE INSTRUCTIONS: Patient is to contact hospice if there is worsening. He can also call our office or oncologist at [facility] if needed. Patient will be discharged home with home hospice. Discharge follow up as needed.   TIME SPENT: This discharge took over 35 minutes.  ____________________________ Cheral Marker. Ola Spurr, MD dpf:cms D: 07/11/2012 16:18:21 ET T: 07/12/2012 12:04:20 ET JOB#: [id number]  cc: Cheral Marker. Ola Spurr, MD, <Dictator> Tag Wurtz Ola Spurr  MD ELECTRONICALLY SIGNED 07/24/2012 21:42

## 2014-12-09 NOTE — Consult Note (Signed)
    Comments   Notified by CM that family had some questions. Spoke with wife and patient at length. They now say they have decided to go home and ask about hospice care. Discussed hospice care in the home. Pt is very hospice appropriate. They want to pursue this so will order hospice screening. Note that wife does still plan to pursue workup at [facility] but this should not inhibit him from receiving home hospice.  dx: [place], secondary: debility  15 minutes  Electronic Signatures: Patterson Hollenbaugh, Kirt Boys (NP)  (Signed 31-Oct-13 14:58)  Authored: Palliative Care   Last Updated: 31-Oct-13 14:58 by Irean Hong (NP)

## 2014-12-09 NOTE — H&P (Signed)
PATIENT NAME:  Jon Roberts, Jon Roberts MR#:  [id number] DATE OF BIRTH:  1946/08/14  DATE OF ADMISSION:  05/03/2012  REFERRING PHYSICIAN: ER physician, Dr. Benjaman Lobe PRIMARY CARE PHYSICIAN: Dr. Mable Fill  ONCOLOGIST: Dr. Grayland Ormond   CHIEF COMPLAINT: Chest pain, shortness of breath.   HISTORY OF PRESENT ILLNESS: Patient is a 69 year old male with past medical history of arthritis, gastroesophageal reflux disease who was recently diagnosed with hepatocellular cancer in June 2013. At that time he also had portal vein thrombosis. Patient reports that since yesterday evening he had been having some shortness of breath and this morning woke up with a feeling of concrete block sitting on his chest and it was very hard for him to take a deep breath. During his recent blood work at the [facility] he was told that his oxygen levels are barely within normal limits. He is being admitted for further management. ALLERGIES: Celebrex.   CURRENT MEDICATIONS:  1. Omeprazole 20 mg a day. Patient reports he takes it before lunch. 2. Nexavar 200 mg 2 tablets b.i.d.   PAST MEDICAL HISTORY: 1. Recent diagnosis of hepatocellular cancer in June 2011. Patient follows up with Dr. Grayland Ormond. He is on sorafenib oral chemotherapy which he takes throughout the day.  2. Osteoarthritis.  3. Gastroesophageal reflux disease.   PAST SURGICAL HISTORY:  1. Back surgery. 2. Partial colectomy.  3. Cholecystectomy.   FAMILY HISTORY: Mother died of pneumonia in her 43s. Father had hypertension and heart disease.   SOCIAL HISTORY: Quit smoking in 1982. Drinks occasional alcohol. No history of drug abuse. He is married and lives with his wife.   REVIEW OF SYSTEMS: CONSTITUTIONAL: Denies fever, fatigue, weakness. EYES: Denies any blurred or double vision. ENT: Denies any tinnitus, ear pain. RESPIRATORY: Reports shortness of breath, painful respiration. CARDIOVASCULAR: Reports chest pain. Denies any palpitations, syncope. GASTROINTESTINAL:  Denies any nausea, vomiting, diarrhea, abdominal pain. GENITOURINARY: Denies any dysuria, hematuria. ENDOCRINE: Denies any polyuria or nocturia. HEME/LYMPH: Denies any anemia, easy bruisability. INTEGUMENTARY: Denies any acne, rash. MUSCULOSKELETAL: Denies any swelling, gout. NEUROLOGICAL: Denies any numbness, weakness. PSYCH: Denies any anxiety, depression.   PHYSICAL EXAMINATION:  VITAL SIGNS: Temperature 97.9, heart rate 91, respiratory rate 18, blood pressure 155/97, pulse oximetry 97%.   GENERAL: Patient is a 69 year old Caucasian male laying comfortably in bed, not in acute distress.   HEAD: Atraumatic, normocephalic.   EYES: No pallor, icterus, or cyanosis. Pupils are equal, round, and reactive to light and accommodation. Extraocular movements intact.   ENT: Wet mucous membranes. No oropharyngeal erythema or thrush.   NECK: Supple. No masses. No JVD. No thyromegaly or lymphadenopathy.   CHEST WALL: No tenderness to palpation. Not using accessory muscles of respiration. No intercostal muscle retractions.   LUNGS: Bilaterally clear to auscultation. No wheezing, rales, or rhonchi.   CARDIOVASCULAR: S1 and S2 regular. No murmur, rubs, or gallops.   ABDOMEN: Soft, nontender, nondistended. No guarding. No rigidity. Normal bowel sounds.   SKIN: No rashes or lesions.   PERIPHERIES: No pedal edema. 2+ pedal pulses.   MUSCULOSKELETAL: No cyanosis or clubbing.   NEUROLOGIC: Awake, alert, oriented x3. Nonfocal neurological exam. Cranial nerve grossly intact.   PSYCH: Normal mood and affect.   LABORATORY, DIAGNOSTIC, AND RADIOLOGICAL DATA: D-dimer more than 6, glucose 150, BUN 7, creatinine 0.84, sodium 137, potassium 3.5, chloride 106, CO2 23, calcium 8.9, bilirubin 2.4, alkaline phosphatase 348, ALT 72, AST 107. CBC normal. Cardiac enzymes negative. EKG shows no evidence of any acute ischemic changes.   ASSESSMENT AND  PLAN: 69 year old male with history of arthritis, gastroesophageal  reflux disease and recent diagnosis of hepatocellular cancer presents with chest pain and shortness of breath.  1. Chest pain. differential diagnosis includes pulmonary embolism in view of underlaying malignancy versus angina.  Will admit him to the hospital and obtain CT chest for pulmonary embolism. Will also obtain 2-D echocardiogram, serial cardiac enzymes. Will empirically start him on a heparin drip for the time being. Further management will depend on the results of the cardiac enzymes, echo and CT chest. Will obtain inpatient stress test. 2. Recently diagnosed liver cancer. Will continue patient's sorafenib twice a day. Patient may take his own medications. I have looked at the side effect profile of this medication. In the majority of cases it causes hypertension . 3. Gastroesophageal reflux disease. Continue PPI therapy. Patient reports that he takes it at lunchtime.  4. Osteoarthritis. Will place on p.r.n. Tylenol.  5. Mild thrombocytopenia possibly related to patient's liver cancer.   6. Hyperglycemia, possibly reactive.  7. Elevated liver function tests, likely due to hepatocellular cancer.   Reviewed all medical records, discussed with the ED physician, discussed with the patient and his wife the plan of care and management.   TIME SPENT: 75 minutes.   ____________________________ Cherre Huger, MD sp:cms D: 05/03/2012 12:27:43 ET T: 05/03/2012 12:43:07 ET JOB#: [id number]  cc: Cherre Huger, MD, <Dictator> Dianah Field. Mable Fill, MD  Cherre Huger MD ELECTRONICALLY SIGNED 05/03/2012 16:01
